# Patient Record
Sex: Male | Born: 1975 | Race: White | Hispanic: No
Health system: Southern US, Community
[De-identification: ages and names within clinical notes are randomized; demographics above are authoritative.]

---

## 2000-11-21 ENCOUNTER — Encounter: Payer: Self-pay | Admitting: Emergency Medicine

## 2000-11-21 ENCOUNTER — Emergency Department (HOSPITAL_COMMUNITY): Admission: AC | Admit: 2000-11-21 | Discharge: 2000-11-21 | Payer: Self-pay

## 2000-12-04 ENCOUNTER — Encounter: Payer: Self-pay | Admitting: Internal Medicine

## 2000-12-04 ENCOUNTER — Ambulatory Visit (HOSPITAL_COMMUNITY): Admission: RE | Admit: 2000-12-04 | Discharge: 2000-12-04 | Payer: Self-pay | Admitting: Internal Medicine

## 2003-04-19 ENCOUNTER — Emergency Department (HOSPITAL_COMMUNITY): Admission: EM | Admit: 2003-04-19 | Discharge: 2003-04-19 | Payer: Self-pay | Admitting: Family Medicine

## 2003-08-18 ENCOUNTER — Emergency Department (HOSPITAL_COMMUNITY): Admission: EM | Admit: 2003-08-18 | Discharge: 2003-08-18 | Payer: Self-pay | Admitting: *Deleted

## 2008-06-06 ENCOUNTER — Emergency Department (HOSPITAL_COMMUNITY): Admission: EM | Admit: 2008-06-06 | Discharge: 2008-06-06 | Payer: Self-pay | Admitting: Family Medicine

## 2013-08-23 ENCOUNTER — Emergency Department (HOSPITAL_COMMUNITY): Payer: 59

## 2013-08-23 ENCOUNTER — Emergency Department (HOSPITAL_COMMUNITY)
Admission: EM | Admit: 2013-08-23 | Discharge: 2013-08-23 | Disposition: A | Discharge status: Home / Self Care | Payer: 59 | Attending: Emergency Medicine | Admitting: Emergency Medicine

## 2013-08-23 ENCOUNTER — Encounter (HOSPITAL_COMMUNITY): Payer: Self-pay | Admitting: Emergency Medicine

## 2013-08-23 DIAGNOSIS — Y9289 Other specified places as the place of occurrence of the external cause: Secondary | ICD-10-CM | POA: Insufficient documentation

## 2013-08-23 DIAGNOSIS — Y9389 Activity, other specified: Secondary | ICD-10-CM | POA: Insufficient documentation

## 2013-08-23 DIAGNOSIS — S1093XA Contusion of unspecified part of neck, initial encounter: Secondary | ICD-10-CM

## 2013-08-23 DIAGNOSIS — S0003XA Contusion of scalp, initial encounter: Secondary | ICD-10-CM | POA: Insufficient documentation

## 2013-08-23 DIAGNOSIS — IMO0002 Reserved for concepts with insufficient information to code with codable children: Secondary | ICD-10-CM | POA: Insufficient documentation

## 2013-08-23 DIAGNOSIS — S0083XA Contusion of other part of head, initial encounter: Secondary | ICD-10-CM | POA: Insufficient documentation

## 2013-08-23 DIAGNOSIS — S0990XA Unspecified injury of head, initial encounter: Secondary | ICD-10-CM

## 2013-08-23 DIAGNOSIS — Z79899 Other long term (current) drug therapy: Secondary | ICD-10-CM | POA: Insufficient documentation

## 2013-08-23 DIAGNOSIS — W1809XA Striking against other object with subsequent fall, initial encounter: Secondary | ICD-10-CM | POA: Insufficient documentation

## 2013-08-23 DIAGNOSIS — S41111A Laceration without foreign body of right upper arm, initial encounter: Secondary | ICD-10-CM

## 2013-08-23 DIAGNOSIS — Z23 Encounter for immunization: Secondary | ICD-10-CM | POA: Insufficient documentation

## 2013-08-23 DIAGNOSIS — W278XXA Contact with other nonpowered hand tool, initial encounter: Secondary | ICD-10-CM | POA: Insufficient documentation

## 2013-08-23 DIAGNOSIS — S51809A Unspecified open wound of unspecified forearm, initial encounter: Secondary | ICD-10-CM | POA: Insufficient documentation

## 2013-08-23 MED ORDER — TETANUS-DIPHTH-ACELL PERTUSSIS 5-2.5-18.5 LF-MCG/0.5 IM SUSP
0.5000 mL | Freq: Once | INTRAMUSCULAR | Status: AC
  Administered 2013-08-23: 0.5 mL via INTRAMUSCULAR
  Filled 2013-08-23: qty 0.5

## 2013-08-23 MED ORDER — OXYCODONE-ACETAMINOPHEN 5-325 MG PO TABS
2.0000 | ORAL_TABLET | Freq: Once | ORAL | Status: AC
  Administered 2013-08-23: 2 via ORAL
  Filled 2013-08-23: qty 2

## 2013-08-23 MED ORDER — HYDROCODONE-ACETAMINOPHEN 5-325 MG PO TABS: 1.0000 | ORAL_TABLET | ORAL | Status: DC | PRN

## 2013-08-23 NOTE — ED Provider Notes (Signed)
Medical screening examination/treatment/procedure(s) were performed by non-physician practitioner and as supervising physician I was immediately available for consultation/collaboration.   EKG Interpretation   Date/Time:  Tuesday August 23 2013 10:41:56 EDT Ventricular Rate:  65 PR Interval:  140 QRS Duration: 68 QT Interval:  373 QTC Calculation: 388 R Axis:   80 Text Interpretation:  Sinus rhythm Ventricular premature complex Consider  left ventricular hypertrophy Anterior Q waves, possibly due to LVH J point  elevation No previous ECGs available Confirmed by YAO  MD, DAVID ([id number])  on 08/23/2013 10:45:26 AM        Richardean Canalavid H Yao, MD 08/23/13 1536

## 2013-08-23 NOTE — Discharge Instructions (Signed)
Take Vicodin as needed for pain. Refer to attached documents for more information.  °

## 2013-08-23 NOTE — ED Notes (Signed)
Patient transported to X-ray 

## 2013-08-23 NOTE — Progress Notes (Signed)
LACERATION REPAIR Performed by: Levert FeinsteinMcIntyre, Helyn Schwan Authorized by: Levert FeinsteinMcIntyre, Marque Bango Consent: Verbal consent obtained. Risks and benefits: risks, benefits and alternatives were discussed Consent given by: patient Patient identity confirmed: provided demographic data Prepped and Draped in normal sterile fashion Wound explored, one small piece of leaf removed  Laceration Location: right forearm (two total lacerations)  Laceration Length: 5cm x2  After irrigation, no foreign bodies seen or palpated  Anesthesia: local infiltration  Local anesthetic: lidocaine 2% without epinephrine  Anesthetic total: 5 ml  Irrigation method: syringe Amount of cleaning: standard  Skin closure: 4-0 vicryl and 4-0 ethilon  Number of sutures: 19 total skin stitches, 3 subcutaneous stitches  Technique: simple interrupted skin sutures overlying 3 subcutaneous  Patient tolerance: Patient tolerated the procedure well with no immediate complications.  The laceration was examined by Dr. Denton LankSteinl after repair and deemed satisfactorily repaired.  Levert FeinsteinBrittany Liborio Saccente, MD Family Medicine PGY-2

## 2013-08-23 NOTE — ED Notes (Signed)
Per pt, cut right arm with prunners after falling and hitting head-states he loss consciousness for a few seconds

## 2013-08-23 NOTE — ED Provider Notes (Signed)
CSN: [id number]     Arrival date & time 08/23/13  1016 History   First MD Initiated Contact with Patient 08/23/13 1024     Chief Complaint  Patient presents with  . Extremity Laceration  . hit head      (Consider location/radiation/quality/duration/timing/severity/associated sxs/prior Treatment) HPI Comments: Patient is a 38 year old male who presents after his work ladder collapsed and he fell. Patient reports hitting his head on the concrete on the ground and cutting his right arm on pruning blade as he was falling. Patient is unsure how high off the ground he was. Patient reports brief loss of consciousness. He complains of throbbing head pain without radiation. He applied pressure to the laceration of the right arm which controlled the bleeding. No other injuries. No aggravating factors.     History reviewed. No pertinent past medical history. History reviewed. No pertinent past surgical history. No family history on file. History  Substance Use Topics  . Smoking status: Never Smoker   . Smokeless tobacco: Not on file  . Alcohol Use: No    Review of Systems  Constitutional: Negative for fever, chills and fatigue.  HENT: Negative for trouble swallowing.   Eyes: Negative for visual disturbance.  Respiratory: Negative for shortness of breath.   Cardiovascular: Negative for chest pain and palpitations.  Gastrointestinal: Negative for nausea, vomiting, abdominal pain and diarrhea.  Genitourinary: Negative for dysuria and difficulty urinating.  Musculoskeletal: Negative for arthralgias and neck pain.  Skin: Positive for wound. Negative for color change.  Neurological: Positive for headaches. Negative for dizziness and weakness.  Psychiatric/Behavioral: Negative for dysphoric mood.      Allergies  Review of patient's allergies indicates no known allergies.  Home Medications   Prior to Admission medications   Medication Sig Start Date End Date Taking? Authorizing Provider   Ascorbic Acid (VITAMIN C) 1000 MG tablet Take 2,000 mg by mouth daily.   Yes Historical Provider, MD  cetirizine (ZYRTEC) 10 MG tablet Take 10 mg by mouth daily as needed for allergies.   Yes Historical Provider, MD  Cholecalciferol (VITAMIN D-3) 1000 UNITS CAPS Take 2 capsules by mouth daily.   Yes Historical Provider, MD  fluticasone (FLONASE) 50 MCG/ACT nasal spray Place 1 spray into both nostrils daily as needed for allergies or rhinitis.   Yes Historical Provider, MD  Multiple Vitamin (MULTIVITAMIN WITH MINERALS) TABS tablet Take 1 tablet by mouth daily.   Yes Historical Provider, MD   BP 116/76  Pulse 65  Temp(Src) 98.1 F (36.7 C) (Oral)  Resp 16  SpO2 100% Physical Exam  Nursing note and vitals reviewed. Constitutional: He is oriented to person, place, and time. He appears well-developed and well-nourished. No distress.  HENT:  Head: Normocephalic and atraumatic.  Hematoma of left occipital area with no open wound and tenderness to palpation.   Eyes: Conjunctivae and EOM are normal. Pupils are equal, round, and reactive to light.  Neck: Normal range of motion.  Cardiovascular: Normal rate and regular rhythm.  Exam reveals no gallop and no friction rub.   No murmur heard. Pulmonary/Chest: Effort normal and breath sounds normal. He has no wheezes. He has no rales. He exhibits no tenderness.  Abdominal: Soft. He exhibits no distension. There is no tenderness. There is no rebound and no guarding.  Musculoskeletal: Normal range of motion.  Painful supination of right forearm. Right mid forearm tenderness to palpation of dorsal right arm. No obvious deformity. 2 separate 4cm deep lacerations just distal of right  AC. Bleeding controlled.   Neurological: He is alert and oriented to person, place, and time. Coordination normal.  Extremity strength and sensation equal and intact bilaterally. Speech is goal-oriented. Moves limbs without ataxia.   Skin: Skin is warm and dry.  2 separate  4cm deep lacerations just distal of right AC. Bleeding controlled.   Psychiatric: He has a normal mood and affect. His behavior is normal.    ED Course  Procedures (including critical care time) Labs Review Labs Reviewed - No data to display  Imaging Review Dg Forearm Right  08/23/2013   CLINICAL DATA:  Laceration proximal right forearm.  EXAM: RIGHT FOREARM - 2 VIEW  COMPARISON:  None.  FINDINGS: Laceration is seen the anterior soft tissues of the proximal forearm without underlying fracture or foreign body.  IMPRESSION: Laceration without fracture or foreign body.   Electronically Signed   By: Drusilla Kannerhomas  Dalessio M.D.   On: 08/23/2013 11:53   Ct Head Wo Contrast  08/23/2013   CLINICAL DATA:  Head trauma, fall  EXAM: CT HEAD WITHOUT CONTRAST  TECHNIQUE: Contiguous axial images were obtained from the base of the skull through the vertex without intravenous contrast.  COMPARISON:  None.  FINDINGS: No skull fracture is noted. Paranasal sinuses and mastoid air cells are unremarkable.  No intracranial hemorrhage, mass effect or midline shift.  No hydrocephalus. The gray and white-matter differentiation is preserved. No intra or extra-axial fluid collection.  IMPRESSION: No acute intracranial abnormality.   Electronically Signed   By: Natasha MeadLiviu  Pop M.D.   On: 08/23/2013 11:05     EKG Interpretation   Date/Time:  Tuesday August 23 2013 10:41:56 EDT Ventricular Rate:  65 PR Interval:  140 QRS Duration: 68 QT Interval:  373 QTC Calculation: 388 R Axis:   80 Text Interpretation:  Sinus rhythm Ventricular premature complex Consider  left ventricular hypertrophy Anterior Q waves, possibly due to LVH J point  elevation No previous ECGs available Confirmed by YAO  MD, DAVID ([id number])  on 08/23/2013 10:45:26 AM      MDM   Final diagnoses:  Head injury, initial encounter  Arm laceration, right, initial encounter    11:24 AM CT head unremarkable for acute changes. No neuro deficits. Patient has right  forearm pain which he will be imaged. Patient will have tdap and percocet here. No neuro vascular compromise.   3:00 PM Laceration repaired by resident. Patient instructed to return in 7 days for suture removal. Patient will be discharged with pain medication.     Emilia BeckKaitlyn Szekalski, PA-C 08/23/13 1506

## 2014-12-05 ENCOUNTER — Encounter (HOSPITAL_COMMUNITY): Payer: Self-pay | Admitting: *Deleted

## 2014-12-05 ENCOUNTER — Emergency Department (HOSPITAL_COMMUNITY): Payer: 59

## 2014-12-05 ENCOUNTER — Emergency Department (HOSPITAL_COMMUNITY)
Admission: EM | Admit: 2014-12-05 | Discharge: 2014-12-05 | Disposition: A | Discharge status: Home / Self Care | Payer: 59 | Attending: Emergency Medicine | Admitting: Emergency Medicine

## 2014-12-05 DIAGNOSIS — S61210A Laceration without foreign body of right index finger without damage to nail, initial encounter: Secondary | ICD-10-CM | POA: Diagnosis not present

## 2014-12-05 DIAGNOSIS — Z79899 Other long term (current) drug therapy: Secondary | ICD-10-CM | POA: Insufficient documentation

## 2014-12-05 DIAGNOSIS — Y9289 Other specified places as the place of occurrence of the external cause: Secondary | ICD-10-CM | POA: Diagnosis not present

## 2014-12-05 DIAGNOSIS — Y9389 Activity, other specified: Secondary | ICD-10-CM | POA: Insufficient documentation

## 2014-12-05 DIAGNOSIS — S61232A Puncture wound without foreign body of right middle finger without damage to nail, initial encounter: Secondary | ICD-10-CM | POA: Diagnosis not present

## 2014-12-05 DIAGNOSIS — Y998 Other external cause status: Secondary | ICD-10-CM | POA: Insufficient documentation

## 2014-12-05 DIAGNOSIS — W228XXA Striking against or struck by other objects, initial encounter: Secondary | ICD-10-CM | POA: Diagnosis not present

## 2014-12-05 DIAGNOSIS — S61219A Laceration without foreign body of unspecified finger without damage to nail, initial encounter: Secondary | ICD-10-CM

## 2014-12-05 MED ORDER — BUPIVACAINE HCL (PF) 0.25 % IJ SOLN
20.0000 mL | Freq: Once | INTRAMUSCULAR | Status: AC
  Administered 2014-12-05: 10 mL
  Filled 2014-12-05: qty 20

## 2014-12-05 MED ORDER — BACITRACIN ZINC 500 UNIT/GM EX OINT
TOPICAL_OINTMENT | Freq: Two times a day (BID) | CUTANEOUS | Status: DC
  Administered 2014-12-05: 1 via TOPICAL
  Filled 2014-12-05: qty 0.9

## 2014-12-05 MED ORDER — LIDOCAINE HCL (PF) 1 % IJ SOLN
5.0000 mL | Freq: Once | INTRAMUSCULAR | Status: AC
  Administered 2014-12-05: 5 mL
  Filled 2014-12-05: qty 5

## 2014-12-05 MED ORDER — CEPHALEXIN 500 MG PO CAPS: 500.0000 mg | ORAL_CAPSULE | Freq: Four times a day (QID) | ORAL | Status: DC

## 2014-12-05 MED ORDER — HYDROCODONE-ACETAMINOPHEN 5-325 MG PO TABS: 1.0000 | ORAL_TABLET | Freq: Four times a day (QID) | ORAL | Status: DC | PRN

## 2014-12-05 MED ORDER — CEPHALEXIN 250 MG PO CAPS
500.0000 mg | ORAL_CAPSULE | Freq: Once | ORAL | Status: AC
  Administered 2014-12-05: 500 mg via ORAL
  Filled 2014-12-05: qty 2

## 2014-12-05 NOTE — Discharge Instructions (Signed)
Call Dr. Janee Morn tomorrow for follow up. Do not take the narcotic if driving or operating machinery because it will make you sleepy.

## 2014-12-05 NOTE — ED Notes (Signed)
Suture cart at bedside 

## 2014-12-05 NOTE — ED Notes (Signed)
PT reports e cut RT Rt index finger gear with a sprocket. Pt reports sprocket stuck in finger.

## 2014-12-05 NOTE — ED Provider Notes (Signed)
CSN: [id number]     Arrival date & time 12/05/14  1310 History  By signing my name below, I, Ronney Lion, attest that this documentation has been prepared under the direction and in the presence of Kerrie Buffalo, NP. Electronically Signed: Ronney Lion, ED Scribe. 12/05/2014. 4:48 PM.    Chief Complaint  Patient presents with  . Laceration   Patient is a 39 y.o. male presenting with skin laceration. The history is provided by the patient. No language interpreter was used.  Laceration Location:  Hand Hand laceration location:  R finger Quality: jagged   Injury mechanism: sprocket. Pain details:    Quality:  Aching   Severity:  Moderate   Timing:  Constant   Progression:  Worsening Relieved by:  Nothing Worsened by:  Nothing tried Ineffective treatments:  None tried Tetanus status:  Up to date   HPI Comments: Edward Hanna is a 39 y.o. male who presents to the Emergency Department complaining of a laceration to his right index and right middle fingers after accidentally cutting his fingers on a sprocket. Patient states he came in to the ED because he started to have numbness radiating up his right forearm. He reports his tetanus vaccination is UTD.  History reviewed. No pertinent past medical history. History reviewed. No pertinent past surgical history. History reviewed. No pertinent family history. Social History  Substance Use Topics  . Smoking status: Never Smoker   . Smokeless tobacco: Never Used  . Alcohol Use: No    Review of Systems  Skin: Positive for wound.  All other systems reviewed and are negative.  Allergies  Review of patient's allergies indicates no known allergies.  Home Medications   Prior to Admission medications   Medication Sig Start Date End Date Taking? Authorizing Provider  Ascorbic Acid (VITAMIN C) 1000 MG tablet Take 2,000 mg by mouth daily.    Historical Provider, MD  cephALEXin (KEFLEX) 500 MG capsule Take 1 capsule (500 mg total) by mouth  4 (four) times daily. 12/05/14   Hope Orlene Och, NP  cetirizine (ZYRTEC) 10 MG tablet Take 10 mg by mouth daily as needed for allergies.    Historical Provider, MD  Cholecalciferol (VITAMIN D-3) 1000 UNITS CAPS Take 2 capsules by mouth daily.    Historical Provider, MD  fluticasone (FLONASE) 50 MCG/ACT nasal spray Place 1 spray into both nostrils daily as needed for allergies or rhinitis.    Historical Provider, MD  HYDROcodone-acetaminophen (NORCO) 5-325 MG tablet Take 1 tablet by mouth every 6 (six) hours as needed. 12/05/14   Hope Orlene Och, NP  Multiple Vitamin (MULTIVITAMIN WITH MINERALS) TABS tablet Take 1 tablet by mouth daily.    Historical Provider, MD   BP 117/92 mmHg  Pulse 66  Temp(Src) 98.4 F (36.9 C) (Oral)  Resp 14  Ht  (1.854 m)  Wt 182 lb (82.555 kg)  BMI 24.02 kg/m2  SpO2 97% Physical Exam  Constitutional: He is oriented to person, place, and time. He appears well-developed and well-nourished. No distress.  HENT:  Head: Normocephalic and atraumatic.  Eyes: Conjunctivae and EOM are normal.  Neck: Neck supple. No tracheal deviation present.  Cardiovascular: Normal rate.   Pulmonary/Chest: Effort normal. No respiratory distress.  Musculoskeletal: Normal range of motion.  Decreased sensation in the distal portion of the right index finger.  Neurological: He is alert and oriented to person, place, and time.  Skin: Skin is warm and dry.  Puncture wound to the right middle finger. Right index  finger has jagged lacerations at the PIP.  Psychiatric: He has a normal mood and affect. His behavior is normal.  Nursing note and vitals reviewed.   ED Course  Procedures (including critical care time)  DIAGNOSTIC STUDIES: Oxygen Saturation is 97% on RA, normal by my interpretation.    COORDINATION OF CARE: 1:52 PM - Discussed treatment plan with pt at bedside which includes XR and injection of local anesthetic to further examine the wound. Pt verbalized understanding and  agreed to plan.  Dg Hand Complete Right  12/05/2014  CLINICAL DATA:  Right finger laceration EXAM: RIGHT HAND - COMPLETE 3+ VIEW COMPARISON:  None. FINDINGS: Three views of the right hand submitted. No acute fracture or subluxation. No radiopaque foreign body. IMPRESSION: Negative. Electronically Signed   By: Natasha Mead M.D.   On: 12/05/2014 14:43   3:00 PM - Consult with attending physician Dr. Littie Deeds who recommends f/u with hand surgeon to evaluate for possible nerve damage.   DIGITAL BLOCK PROCEDURE NOTE The patient's identification was confirmed and consent was obtained. This procedure was performed by Kerrie Buffalo, NP, at 3:31 PM. Site: Right index finger Sterile procedures observed Anesthetic used (type and amt): lidocaine 1% and bupivacaine 0.25% mixed equally, 5 mL  Patient tolerated procedure well without complications.   LACERATION REPAIR PROCEDURE NOTE The patient's identification was confirmed and consent was obtained. This procedure was performed by Kerrie Buffalo, NP, at 3:31 PM. Site: Right index finger Sterile procedures observed Anesthetic used (type and amt): digital block (see above note) Suture type/size:4-0 prolene Length: 2 cm, jagged L-shaped # of Sutures: 3 Technique: simple interrupted, loosely closed Complexity Antibx ointment applied Tetanus UTD Site anesthetized, irrigated with NS, explored without evidence of foreign body, wound well approximated, site covered with dry, sterile dressing.  Patient tolerated procedure well without complications. Instructions for care discussed verbally and patient provided with additional written instructions for homecare and f/u.  Discussed with pt that he may have injured the nerve due to decreased sensation. Advised f/u with hand surgeon Dr. Janee Morn. Pt verbalized understanding and agreed to plan.     MDM  39 y.o. male with laceration to the right index finger stable for d/c without neurovascular compromise. Decreased  sensation to the distal aspect of the finger. Discussed with the patient in detail need for follow up with the hand surgeon. Patient agrees with plan. Will start antibiotics and pain management. Patient will return if if any problems occur.   Final diagnoses:  Laceration of finger, right, initial encounter   I personally performed the services described in this documentation, which was scribed in my presence. The recorded information has been reviewed and is accurate.     [address] [place], NP 12/08/14 1610  Mirian Mo, MD 12/10/14 2005

## 2014-12-05 NOTE — ED Notes (Signed)
Marcaine requested from pharmacy.

## 2016-05-14 IMAGING — DX DG HAND COMPLETE 3+V*R*
3 series · 3 of 3 positions shown · non-contrast
Comparison: None.

CLINICAL DATA: Right finger laceration

EXAM:
RIGHT HAND - COMPLETE 3+ VIEW

[x hand pa right]
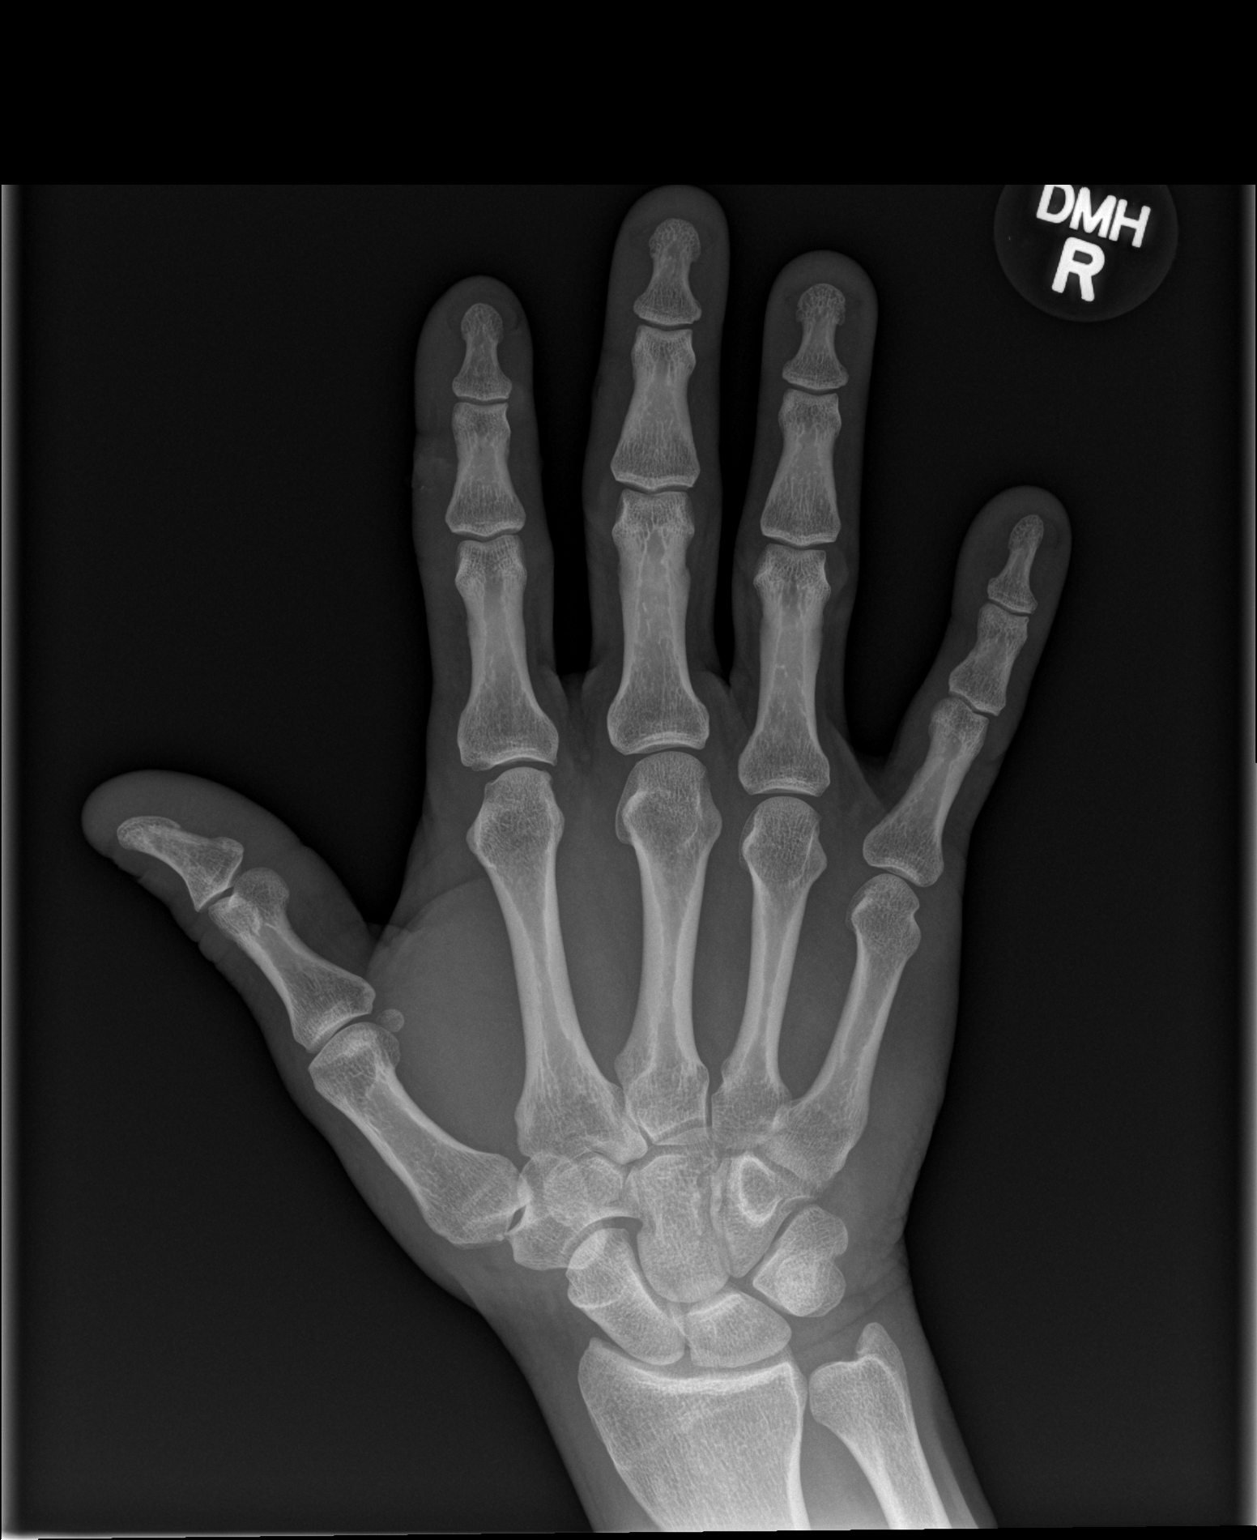

[x hand obl right]
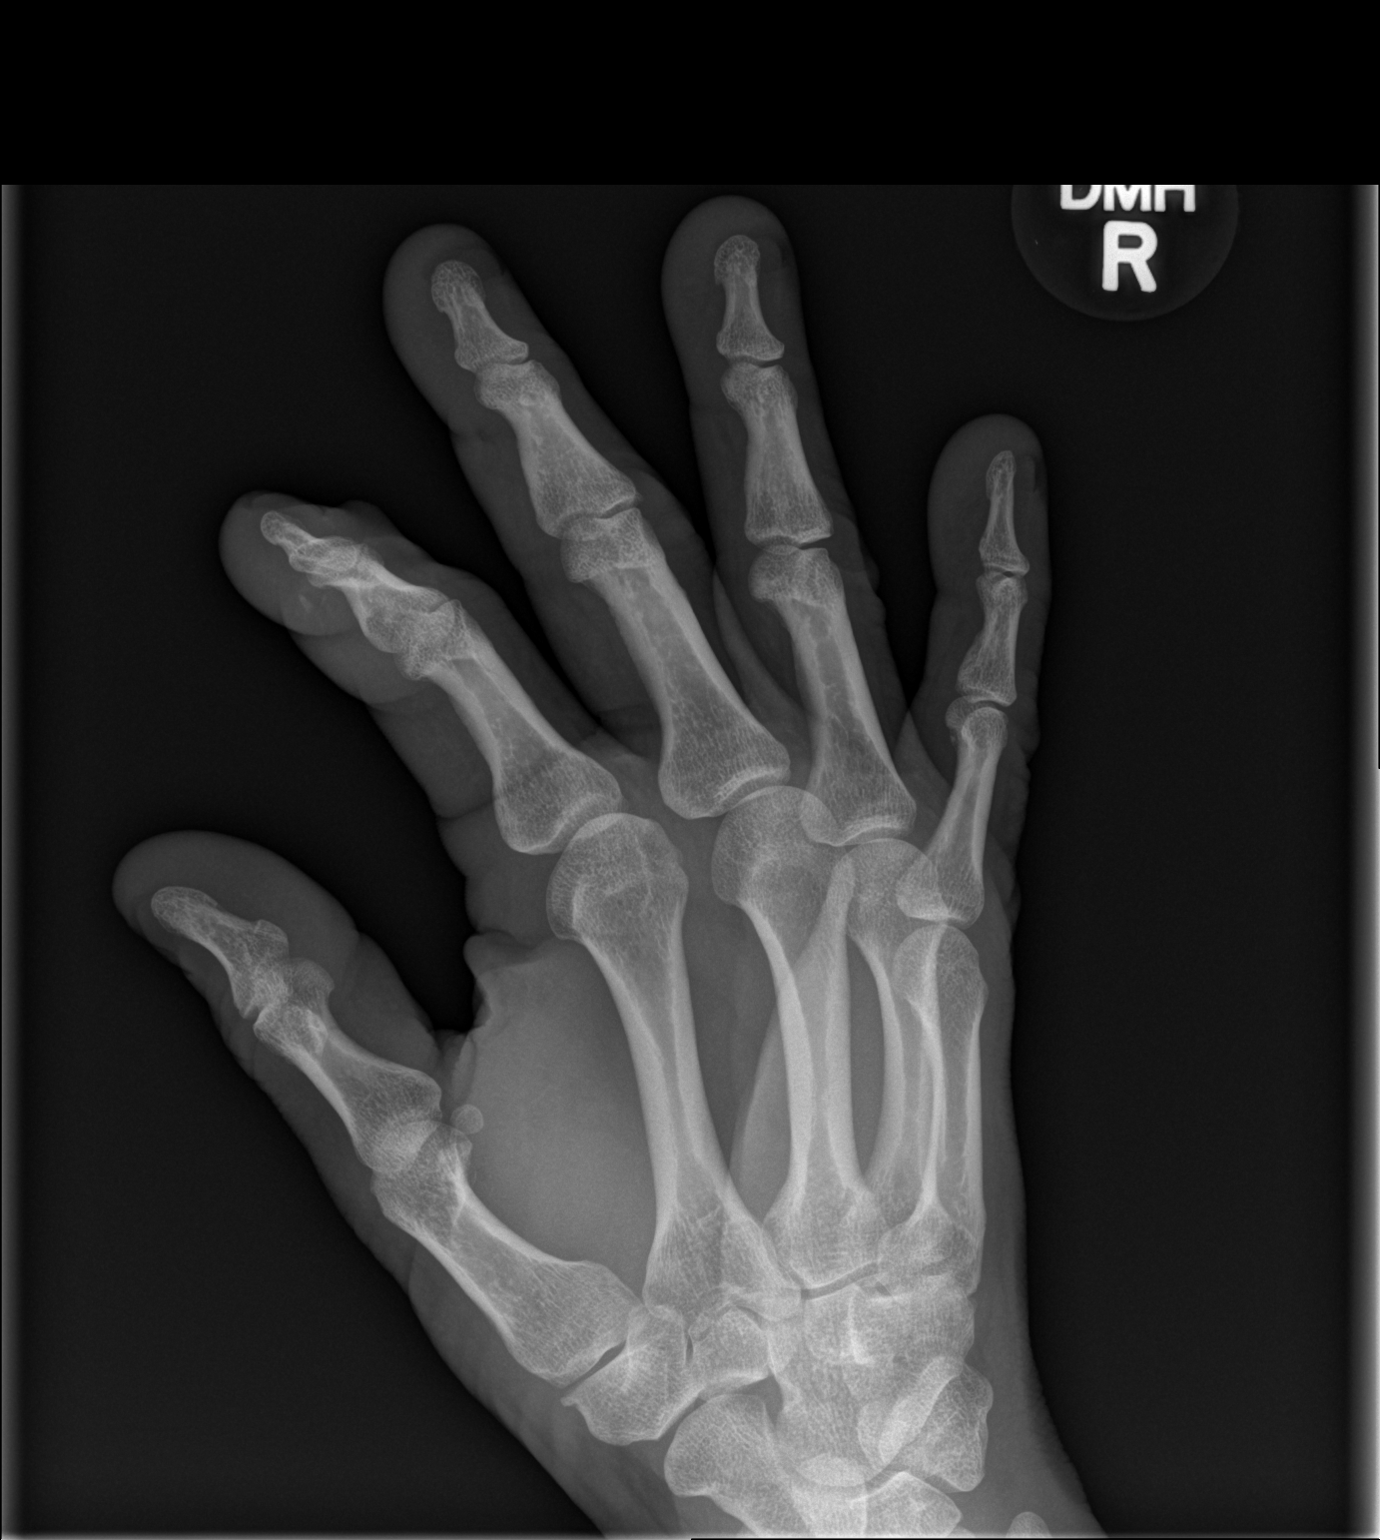

[x hand lat right]
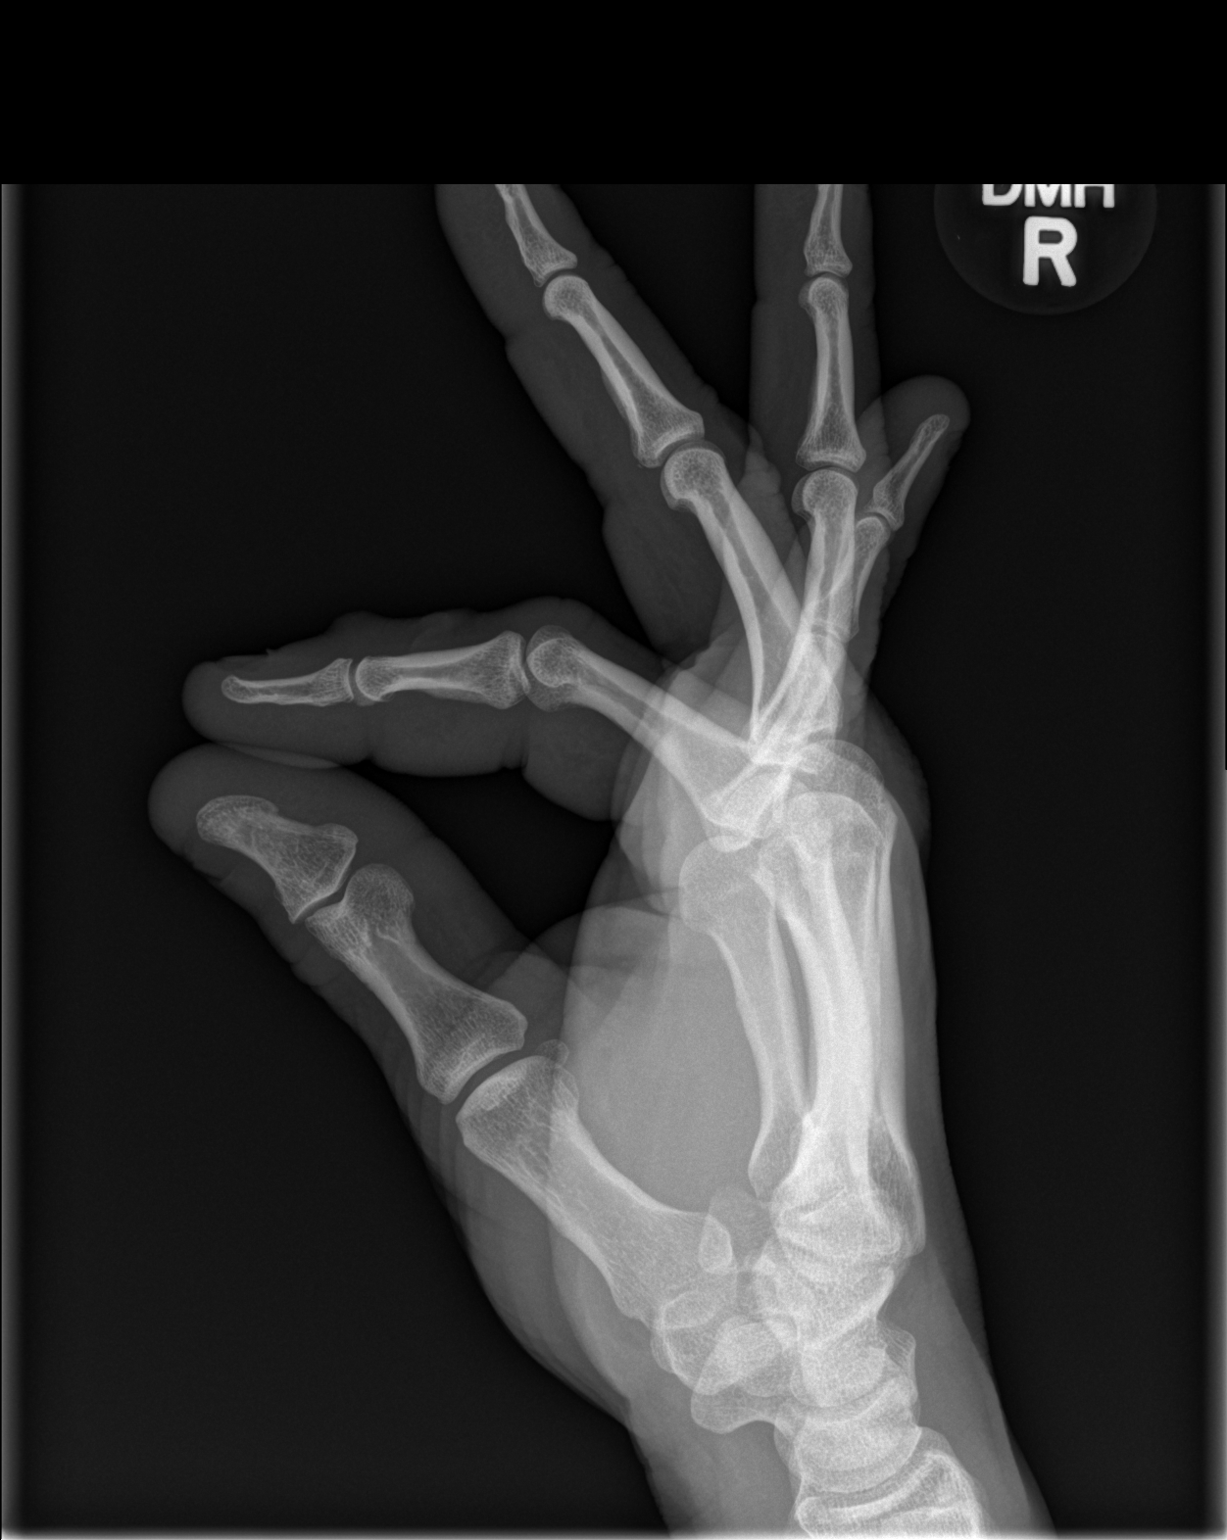

[3 of 3 positions shown; findings below may reference images not displayed]

FINDINGS: Three views of the right hand submitted. No acute fracture or
subluxation. No radiopaque foreign body.
IMPRESSION: Negative.

## 2022-02-28 ENCOUNTER — Telehealth (HOSPITAL_COMMUNITY): Payer: Self-pay | Admitting: *Deleted

## 2022-02-28 ENCOUNTER — Ambulatory Visit (HOSPITAL_COMMUNITY)
Admission: EM | Admit: 2022-02-28 | Discharge: 2022-02-28 | Disposition: A | Discharge status: Home / Self Care | Payer: 59 | Attending: Family Medicine | Admitting: Family Medicine

## 2022-02-28 ENCOUNTER — Encounter (HOSPITAL_COMMUNITY): Payer: Self-pay

## 2022-02-28 DIAGNOSIS — H1032 Unspecified acute conjunctivitis, left eye: Secondary | ICD-10-CM | POA: Diagnosis not present

## 2022-02-28 DIAGNOSIS — N342 Other urethritis: Secondary | ICD-10-CM | POA: Insufficient documentation

## 2022-02-28 MED ORDER — DOXYCYCLINE HYCLATE 100 MG PO CAPS: 100.0000 mg | ORAL_CAPSULE | Freq: Two times a day (BID) | ORAL | 0 refills | Status: AC

## 2022-02-28 MED ORDER — DOXYCYCLINE HYCLATE 100 MG PO CAPS: 100.0000 mg | ORAL_CAPSULE | Freq: Two times a day (BID) | ORAL | 0 refills | Status: DC

## 2022-02-28 MED ORDER — GENTAMICIN SULFATE 0.3 % OP SOLN: 2.0000 [drp] | Freq: Three times a day (TID) | OPHTHALMIC | 0 refills | Status: DC

## 2022-02-28 MED ORDER — GENTAMICIN SULFATE 0.3 % OP SOLN: 2.0000 [drp] | Freq: Three times a day (TID) | OPHTHALMIC | 0 refills | Status: AC

## 2022-02-28 MED ORDER — CEFTRIAXONE SODIUM 500 MG IJ SOLR
INTRAMUSCULAR | Status: AC
  Filled 2022-02-28: qty 500

## 2022-02-28 MED ORDER — LIDOCAINE HCL (PF) 1 % IJ SOLN
INTRAMUSCULAR | Status: AC
  Filled 2022-02-28: qty 2

## 2022-02-28 MED ORDER — CEFTRIAXONE SODIUM 500 MG IJ SOLR
500.0000 mg | INTRAMUSCULAR | Status: DC
  Administered 2022-02-28: 500 mg via INTRAMUSCULAR

## 2022-02-28 NOTE — Discharge Instructions (Addendum)
Staff will notify you of any positives on your swabs.  If you download MyChart, you would see the results a little faster  You have been given a shot of ceftriaxone 500 mg  Take doxycycline 100 mg --1 capsule 2 times daily for 7 days  Put gentamicin eyedrops in the affected eye(s) 3 times daily for 7 days.

## 2022-02-28 NOTE — ED Provider Notes (Addendum)
[facility]    CSN: [id number] Arrival date & time: 02/28/22  1552      History   Chief Complaint Chief Complaint  Patient presents with   Penile Discharge   Eye Drainage    HPI Edward Hanna is a 47 y.o. male.    Penile Discharge   Here for penile discharge that began 3 days ago.  No dysuria.  No abdominal pain or vomiting.  No fever  Also about 3 days ago he began having tearing and irritation of his left eye.  It has worsened and he now has swelling of the upper and lower eyelid.  No known STD exposures  No congestion or upper respiratory symptoms or cough History reviewed. No pertinent past medical history.  There are no problems to display for this patient.   History reviewed. No pertinent surgical history.     Home Medications    Prior to Admission medications   Medication Sig Start Date End Date Taking? Authorizing Provider  doxycycline (VIBRAMYCIN) 100 MG capsule Take 1 capsule (100 mg total) by mouth 2 (two) times daily for 7 days. 02/28/22 03/07/22 Yes Arnaldo Heffron, Gwenlyn Perking, MD  gentamicin (GARAMYCIN) 0.3 % ophthalmic solution Place 2 drops into both eyes 3 (three) times daily for 7 days. 02/28/22 03/07/22 Yes Giordana Weinheimer, Gwenlyn Perking, MD  Ascorbic Acid (VITAMIN C) 1000 MG tablet Take 2,000 mg by mouth daily.    [provider]  cetirizine (ZYRTEC) 10 MG tablet Take 10 mg by mouth daily as needed for allergies.    [provider]  Cholecalciferol (VITAMIN D-3) 1000 UNITS CAPS Take 2 capsules by mouth daily.    [provider]  fluticasone (FLONASE) 50 MCG/ACT nasal spray Place 1 spray into both nostrils daily as needed for allergies or rhinitis.    [provider]  Multiple Vitamin (MULTIVITAMIN WITH MINERALS) TABS tablet Take 1 tablet by mouth daily.    [provider]    Family History History reviewed. No pertinent family history.  Social History Social History   Tobacco Use   Smoking status: Never    Smokeless tobacco: Never  Substance Use Topics   Alcohol use: No   Drug use: No     Allergies   Patient has no known allergies.   Review of Systems Review of Systems  Genitourinary:  Positive for penile discharge.     Physical Exam Triage Vital Signs ED Triage Vitals  Enc Vitals Group     BP 02/28/22 1711 (!) 137/98     Pulse --      Resp 02/28/22 1711 16     Temp 02/28/22 1711 98.6 F (37 C)     Temp Source 02/28/22 1711 Oral     SpO2 02/28/22 1711 99 %     Weight --      Height --      Head Circumference --      Peak Flow --      Pain Score 02/28/22 1712 0     Pain Loc --      Pain Edu? --      Excl. in [place]? --    No data found.  Updated Vital Signs BP (!) 137/98 (BP Location: Left Arm)   Temp 98.6 F (37 C) (Oral)   Resp 16   SpO2 99%   Visual Acuity Right Eye Distance:   Left Eye Distance:   Bilateral Distance:    Right Eye Near:   Left Eye Near:  Bilateral Near:     Physical Exam Vitals reviewed.  Constitutional:      General: He is not in acute distress.    Appearance: He is not ill-appearing, toxic-appearing or diaphoretic.  HENT:     Nose: Nose normal.     Mouth/Throat:     Mouth: Mucous membranes are moist.     Pharynx: No oropharyngeal exudate or posterior oropharyngeal erythema.  Eyes:     Extraocular Movements: Extraocular movements intact.     Pupils: Pupils are equal, round, and reactive to light.     Comments: The left eyes injected.  The upper and lower eyelids are erythematous and mildly swollen.  There is some erythema of the lower eyelid and a spot about 1/2 cm in diameter and a smaller 1 about 2 mm in diameter laterally.  No ulcerations noted.  There is some discharge in the inner canthus.  Cardiovascular:     Rate and Rhythm: Normal rate and regular rhythm.     Heart sounds: No murmur heard. Pulmonary:     Effort: Pulmonary effort is normal.     Breath sounds: Normal breath sounds.  Musculoskeletal:     Cervical  back: Neck supple.  Lymphadenopathy:     Cervical: No cervical adenopathy.  Skin:    Coloration: Skin is not jaundiced or pale.  Neurological:     Mental Status: He is alert.      UC Treatments / Results  Labs (all labs ordered are listed, but only abnormal results are displayed) Labs Reviewed  CYTOLOGY, (ORAL, ANAL, URETHRAL) ANCILLARY ONLY  CYTOLOGY, (ORAL, ANAL, URETHRAL) ANCILLARY ONLY    EKG   Radiology No results found.  Procedures Procedures (including critical care time)  Medications Ordered in UC Medications  cefTRIAXone (ROCEPHIN) injection 500 mg (has no administration in time range)    Initial Impression / Assessment and Plan / UC Course  I have reviewed the triage vital signs and the nursing notes.  Pertinent labs & imaging results that were available during my care of the patient were reviewed by me and considered in my medical decision making (see chart for details).        He did a self swab of his urethra.  Though the the eye infection may be a routine conjunctivitis, I swabbed the discharge from the left eye for gonorrhea and chlamydia also.  Staff will notify him of any positives.  The concern about STD in his eye, we are treating him with Rocephin tonight.  Also doxycycline is sent in.  Gentamicin eyedrops are sent in for topical treatment for possible routine conjunctivitis. Final Clinical Impressions(s) / UC Diagnoses   Final diagnoses:  Urethritis  Acute conjunctivitis of left eye, unspecified acute conjunctivitis type     Discharge Instructions      Staff will notify you of any positives on your swabs.  If you download MyChart, you would see the results a little faster  You have been given a shot of ceftriaxone 500 mg  Take doxycycline 100 mg --1 capsule 2 times daily for 7 days  Put gentamicin eyedrops in the affected eye(s) 3 times daily for 7 days.      ED Prescriptions     Medication Sig Dispense Auth. Provider    doxycycline (VIBRAMYCIN) 100 MG capsule Take 1 capsule (100 mg total) by mouth 2 (two) times daily for 7 days. 14 capsule Barrett Henle, MD   gentamicin (GARAMYCIN) 0.3 % ophthalmic solution Place 2 drops into both  eyes 3 (three) times daily for 7 days. 5 mL Zenia Resides, MD      PDMP not reviewed this encounter.   Zenia Resides, MD 02/28/22 1741    Zenia Resides, MD 02/28/22 [phone]

## 2022-02-28 NOTE — ED Triage Notes (Signed)
Pt reports penile discharge for several days. Pt would like STD-testing.  Pt reports left eye drainage x 2-3 days.

## 2022-03-04 LAB — CYTOLOGY, (ORAL, ANAL, URETHRAL) ANCILLARY ONLY
Chlamydia: NEGATIVE
Chlamydia: NEGATIVE
Comment: NEGATIVE
Comment: NORMAL
Comment: NEGATIVE
Comment: NEGATIVE
Comment: NORMAL
Neisseria Gonorrhea: POSITIVE — AB
Neisseria Gonorrhea: POSITIVE — AB
Trichomonas: POSITIVE — AB

## 2022-03-05 ENCOUNTER — Telehealth (HOSPITAL_COMMUNITY): Payer: Self-pay | Admitting: Emergency Medicine

## 2022-03-05 MED ORDER — METRONIDAZOLE 500 MG PO TABS: 2000.0000 mg | ORAL_TABLET | Freq: Once | ORAL | 0 refills | Status: AC

## 2022-03-11 ENCOUNTER — Emergency Department (HOSPITAL_COMMUNITY): Payer: 59

## 2022-03-11 ENCOUNTER — Other Ambulatory Visit: Payer: Self-pay

## 2022-03-11 ENCOUNTER — Emergency Department (HOSPITAL_COMMUNITY)
Admission: EM | Admit: 2022-03-11 | Discharge: 2022-03-11 | Disposition: A | Discharge status: Home / Self Care | Payer: 59 | Attending: Emergency Medicine | Admitting: Emergency Medicine

## 2022-03-11 ENCOUNTER — Encounter (HOSPITAL_COMMUNITY): Payer: Self-pay | Admitting: Radiology

## 2022-03-11 DIAGNOSIS — W19XXXA Unspecified fall, initial encounter: Secondary | ICD-10-CM

## 2022-03-11 DIAGNOSIS — S63259A Unspecified dislocation of unspecified finger, initial encounter: Secondary | ICD-10-CM

## 2022-03-11 DIAGNOSIS — S2239XA Fracture of one rib, unspecified side, initial encounter for closed fracture: Secondary | ICD-10-CM

## 2022-03-11 DIAGNOSIS — M25522 Pain in left elbow: Secondary | ICD-10-CM | POA: Insufficient documentation

## 2022-03-11 DIAGNOSIS — Z043 Encounter for examination and observation following other accident: Secondary | ICD-10-CM | POA: Diagnosis not present

## 2022-03-11 DIAGNOSIS — W12XXXA Fall on and from scaffolding, initial encounter: Secondary | ICD-10-CM | POA: Diagnosis not present

## 2022-03-11 DIAGNOSIS — S63282A Dislocation of proximal interphalangeal joint of right middle finger, initial encounter: Secondary | ICD-10-CM | POA: Diagnosis not present

## 2022-03-11 DIAGNOSIS — R519 Headache, unspecified: Secondary | ICD-10-CM | POA: Insufficient documentation

## 2022-03-11 DIAGNOSIS — S2232XA Fracture of one rib, left side, initial encounter for closed fracture: Secondary | ICD-10-CM | POA: Diagnosis not present

## 2022-03-11 DIAGNOSIS — R202 Paresthesia of skin: Secondary | ICD-10-CM | POA: Diagnosis not present

## 2022-03-11 DIAGNOSIS — S63286A Dislocation of proximal interphalangeal joint of right little finger, initial encounter: Secondary | ICD-10-CM | POA: Diagnosis not present

## 2022-03-11 DIAGNOSIS — M79641 Pain in right hand: Secondary | ICD-10-CM | POA: Diagnosis not present

## 2022-03-11 DIAGNOSIS — S3991XA Unspecified injury of abdomen, initial encounter: Secondary | ICD-10-CM | POA: Diagnosis not present

## 2022-03-11 DIAGNOSIS — R42 Dizziness and giddiness: Secondary | ICD-10-CM | POA: Diagnosis not present

## 2022-03-11 DIAGNOSIS — Y900 Blood alcohol level of less than 20 mg/100 ml: Secondary | ICD-10-CM | POA: Diagnosis not present

## 2022-03-11 DIAGNOSIS — K5730 Diverticulosis of large intestine without perforation or abscess without bleeding: Secondary | ICD-10-CM | POA: Diagnosis not present

## 2022-03-11 DIAGNOSIS — R9431 Abnormal electrocardiogram [ECG] [EKG]: Secondary | ICD-10-CM | POA: Diagnosis not present

## 2022-03-11 DIAGNOSIS — S62626A Displaced fracture of medial phalanx of right little finger, initial encounter for closed fracture: Secondary | ICD-10-CM | POA: Diagnosis not present

## 2022-03-11 DIAGNOSIS — S59911A Unspecified injury of right forearm, initial encounter: Secondary | ICD-10-CM | POA: Diagnosis not present

## 2022-03-11 DIAGNOSIS — S2231XA Fracture of one rib, right side, initial encounter for closed fracture: Secondary | ICD-10-CM | POA: Insufficient documentation

## 2022-03-11 DIAGNOSIS — N209 Urinary calculus, unspecified: Secondary | ICD-10-CM | POA: Diagnosis not present

## 2022-03-11 DIAGNOSIS — S59902A Unspecified injury of left elbow, initial encounter: Secondary | ICD-10-CM | POA: Diagnosis not present

## 2022-03-11 DIAGNOSIS — S2241XA Multiple fractures of ribs, right side, initial encounter for closed fracture: Secondary | ICD-10-CM | POA: Diagnosis not present

## 2022-03-11 DIAGNOSIS — Y9339 Activity, other involving climbing, rappelling and jumping off: Secondary | ICD-10-CM | POA: Insufficient documentation

## 2022-03-11 DIAGNOSIS — J90 Pleural effusion, not elsewhere classified: Secondary | ICD-10-CM | POA: Diagnosis not present

## 2022-03-11 DIAGNOSIS — M542 Cervicalgia: Secondary | ICD-10-CM | POA: Diagnosis not present

## 2022-03-11 DIAGNOSIS — R1084 Generalized abdominal pain: Secondary | ICD-10-CM | POA: Insufficient documentation

## 2022-03-11 DIAGNOSIS — R69 Illness, unspecified: Secondary | ICD-10-CM | POA: Diagnosis not present

## 2022-03-11 DIAGNOSIS — R079 Chest pain, unspecified: Secondary | ICD-10-CM | POA: Diagnosis not present

## 2022-03-11 LAB — COMPREHENSIVE METABOLIC PANEL
ALT: 82 U/L — ABNORMAL HIGH (ref 0–44)
AST: 105 U/L — ABNORMAL HIGH (ref 15–41)
Albumin: 4.2 g/dL (ref 3.5–5.0)
Alkaline Phosphatase: 61 U/L (ref 38–126)
Anion gap: 12 (ref 5–15)
BUN: 21 mg/dL — ABNORMAL HIGH (ref 6–20)
CO2: 23 mmol/L (ref 22–32)
Calcium: 9.1 mg/dL (ref 8.9–10.3)
Chloride: 103 mmol/L (ref 98–111)
Creatinine, Ser: 1.08 mg/dL (ref 0.61–1.24)
GFR, Estimated: >60 mL/min (ref >60)
Glucose, Bld: 105 mg/dL — ABNORMAL HIGH (ref 70–99)
Potassium: 4.3 mmol/L (ref 3.5–5.1)
Sodium: 138 mmol/L (ref 135–145)
Total Bilirubin: 0.9 mg/dL (ref 0.3–1.2)
Total Protein: 7.1 g/dL (ref 6.5–8.1)

## 2022-03-11 LAB — CBC
HCT: 42.4 % (ref 39.0–52.0)
Hemoglobin: 14.8 g/dL (ref 13.0–17.0)
MCH: 29.1 pg (ref 26.0–34.0)
MCHC: 34.9 g/dL (ref 30.0–36.0)
MCV: 83.3 fL (ref 80.0–100.0)
Platelets: 327 10*3/uL (ref 150–400)
RBC: 5.09 MIL/uL (ref 4.22–5.81)
RDW: 11.5 % (ref 11.5–15.5)
WBC: 16.5 10*3/uL — ABNORMAL HIGH (ref 4.0–10.5)
nRBC: 0 % (ref 0.0–0.2)

## 2022-03-11 LAB — I-STAT CHEM 8, ED
BUN: 21 mg/dL — ABNORMAL HIGH (ref 6–20)
Calcium, Ion: 1.17 mmol/L (ref 1.15–1.40)
Chloride: 105 mmol/L (ref 98–111)
Creatinine, Ser: 1 mg/dL (ref 0.61–1.24)
Glucose, Bld: 100 mg/dL — ABNORMAL HIGH (ref 70–99)
HCT: 43 % (ref 39.0–52.0)
Hemoglobin: 14.6 g/dL (ref 13.0–17.0)
Potassium: 4.4 mmol/L (ref 3.5–5.1)
Sodium: 140 mmol/L (ref 135–145)
TCO2: 26 mmol/L (ref 22–32)

## 2022-03-11 LAB — TYPE AND SCREEN
ABO/RH(D): B NEG
Antibody Screen: NEGATIVE

## 2022-03-11 LAB — SAMPLE TO BLOOD BANK

## 2022-03-11 LAB — PROTIME-INR
INR: 1.1 (ref 0.8–1.2)
Prothrombin Time: 14.3 seconds (ref 11.4–15.2)

## 2022-03-11 LAB — ETHANOL: Alcohol, Ethyl (B): <10 mg/dL (ref <10)

## 2022-03-11 LAB — LACTIC ACID, PLASMA: Lactic Acid, Venous: 1.6 mmol/L (ref 0.5–1.9)

## 2022-03-11 MED ORDER — OXYCODONE-ACETAMINOPHEN 5-325 MG PO TABS: 1.0000 | ORAL_TABLET | Freq: Four times a day (QID) | ORAL | 0 refills | Status: AC | PRN

## 2022-03-11 MED ORDER — IOHEXOL 300 MG/ML  SOLN
100.0000 mL | Freq: Once | INTRAMUSCULAR | Status: AC | PRN
  Administered 2022-03-11: 100 mL via INTRAVENOUS

## 2022-03-11 MED ORDER — FENTANYL CITRATE PF 50 MCG/ML IJ SOSY: 50.0000 ug | PREFILLED_SYRINGE | Freq: Once | INTRAMUSCULAR | Status: DC

## 2022-03-11 MED ORDER — SODIUM CHLORIDE 0.9 % IV BOLUS
1000.0000 mL | Freq: Once | INTRAVENOUS | Status: AC
  Administered 2022-03-11: 1000 mL via INTRAVENOUS

## 2022-03-11 MED ORDER — SODIUM CHLORIDE (PF) 0.9 % IJ SOLN
INTRAMUSCULAR | Status: AC
  Filled 2022-03-11: qty 50

## 2022-03-11 MED ORDER — METHOCARBAMOL 500 MG PO TABS: 500.0000 mg | ORAL_TABLET | Freq: Two times a day (BID) | ORAL | 0 refills | Status: AC

## 2022-03-11 MED ORDER — BUPIVACAINE HCL (PF) 0.5 % IJ SOLN
30.0000 mL | Freq: Once | INTRAMUSCULAR | Status: AC
  Administered 2022-03-11: 30 mL

## 2022-03-11 MED ORDER — BUPIVACAINE HCL 0.5 % IJ SOLN: 50.0000 mL | Freq: Once | INTRAMUSCULAR | Status: DC

## 2022-03-11 MED ORDER — ONDANSETRON HCL 4 MG/2ML IJ SOLN
4.0000 mg | Freq: Once | INTRAMUSCULAR | Status: AC
  Administered 2022-03-11: 4 mg via INTRAVENOUS
  Filled 2022-03-11: qty 2

## 2022-03-11 MED ORDER — MORPHINE SULFATE (PF) 4 MG/ML IV SOLN
4.0000 mg | Freq: Once | INTRAVENOUS | Status: AC
  Administered 2022-03-11: 4 mg via INTRAVENOUS
  Filled 2022-03-11: qty 1

## 2022-03-11 NOTE — ED Provider Notes (Signed)
Patient signed to me by Dr. Gilford Raid pending results of patient's imaging.  Patient also had a known dislocated right fifth digit.  This was relocated by me and postreduction x-rays show good alignment.  Sling applied by nursing and neurovascular status intact afterwards.  Patient also had incidental finding in the cervical spine concerning for possible multiple myeloma.  This was discussed at length with the patient and he will be given instructions for follow-up.  Patient is CT of the chest did show evidence of a right posterior lateral 10th rib fracture.  Patient will be given referral to hand surgery for his finger dislocation.  Will also be given prescription for pain medication muscle relaxants.  Patient agreeable to this and all questions answered   Lacretia Leigh, MD 03/11/22 [phone]

## 2022-03-11 NOTE — ED Triage Notes (Addendum)
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  Pt has pain to left hand, arm, shoulder left ribs, and left flank  C/o sob.  PA and charge RN aware.  C-collar in place in triage

## 2022-03-11 NOTE — ED Notes (Signed)
Continues to cooperate/ tolerate xrays, alert, interactive.

## 2022-03-11 NOTE — ED Provider Notes (Signed)
[facility] DEPT Provider Note   CSN: [id number] Arrival date & time: 03/11/22  1310     History  Chief Complaint  Patient presents with   [name]    Edward Hanna is a 47 y.o. male with no past medical history who comes in after a fall. Pt was placing scaffolding on the roof when one of the scaffolding pieces fell and knocked him off. This led to an 18-20 foot fall onto more scaffolding. He fell on his back which caused his back to fold and his head and feet going towards the floor. He denies loss of consciousness. He was not dizzy before the fall, and has no prior medical history. His pain is mostly located on his right abdomen/chest area and radiates to his back. He also endorses right hand pain and pins and needle feeling. His right 5th digit also is causing him distress, as well as his left elbow. Due to pain on right side, he is in respiratory distress. Lower extremities have full sensation and strength. No urinary or bowel incontinence.    Home Medications Prior to Admission medications   Not on File      Allergies    Patient has no known allergies.    Review of Systems   Review of Systems  Physical Exam Updated Vital Signs BP (!) 129/91   Pulse 77   Temp 97.7 F (36.5 C) (Oral)   Resp (!) 23   SpO2 100%  Physical Exam Pulmonary:     Effort: Tachypnea and respiratory distress present.     Breath sounds: Normal breath sounds.  Abdominal:     Tenderness: There is generalized abdominal tenderness.  Musculoskeletal:     Right shoulder: Swelling and bony tenderness present. Decreased range of motion.     Left shoulder: Normal.     Left elbow: Tenderness present.       ED Results / Procedures / Treatments   Labs (all labs ordered are listed, but only abnormal results are displayed) Labs Reviewed  COMPREHENSIVE METABOLIC PANEL - Abnormal; Notable for the following components:      Result Value   Glucose, Bld 105 (*)    BUN 21  (*)    AST 105 (*)    ALT 82 (*)    All other components within normal limits  CBC - Abnormal; Notable for the following components:   WBC 16.5 (*)    All other components within normal limits  I-STAT CHEM 8, ED - Abnormal; Notable for the following components:   BUN 21 (*)    Glucose, Bld 100 (*)    All other components within normal limits  ETHANOL  LACTIC ACID, PLASMA  PROTIME-INR  URINALYSIS, ROUTINE W REFLEX MICROSCOPIC  TYPE AND SCREEN  SAMPLE TO BLOOD BANK    EKG EKG Interpretation  Date/Time:  Tuesday March 11 2022 14:19:44 EST Ventricular Rate:  70 PR Interval:  133 QRS Duration: 100 QT Interval:  391 QTC Calculation: 422 R Axis:   78 Text Interpretation: Sinus rhythm No significant change since last tracing Confirmed by Isla Pence [phone]) on 03/11/2022 2:41:34 PM  Radiology DG Pelvis Portable  Result Date: 03/11/2022 CLINICAL DATA:  Fall. EXAM: PORTABLE PELVIS 1-2 VIEWS COMPARISON:  None Available. FINDINGS: There is no evidence of pelvic fracture or diastasis. No pelvic bone lesions are seen. IMPRESSION: Negative. Electronically Signed   By: Titus Dubin M.D.   On: 03/11/2022 15:15   DG Forearm Right  Result Date: 03/11/2022 CLINICAL  DATA:  Blunt trauma. Working on saline tile and fell from 18 feet, landing on right side on bar, then falling to floor. EXAM: RIGHT FOREARM - 2 VIEW COMPARISON:  Right fifth finger radiographs 03/11/2022. Right forearm radiographs 08/23/2013. FINDINGS: Fracture/dislocation of the right fifth proximal and middle phalanges is better evaluated on dedicated radiographs. No acute fracture or dislocation of the right radius or ulna. No suspicious bony abnormality. No focal soft tissue swelling. IMPRESSION: No acute fracture or dislocation of th right radius or ulna. Electronically Signed   By: Orvan Falconer M.D   On: 03/11/2022 14:47   DG Chest Port 1 View  Result Date: 03/11/2022 CLINICAL DATA:  Fall from 18 ft.  Left chest  pain. EXAM: PORTABLE CHEST 1 VIEW COMPARISON:  None Available. FINDINGS: 1434 hours. Suboptimal inspiration. Allowing for this, the heart size and mediastinal contours are normal, without evidence of mediastinal hematoma. The lungs appear clear. There is no pleural effusion or pneumothorax. No acute fractures are demonstrated. Telemetry leads overlie the chest. IMPRESSION: No evidence of acute chest injury or active cardiopulmonary process. Suboptimal inspiration. Electronically Signed   By: Carey Bullocks M.D.   On: 03/11/2022 14:44   DG Elbow Complete Left  Result Date: 03/11/2022 CLINICAL DATA:  Pain after trauma EXAM: LEFT ELBOW - COMPLETE 4  VIEW FINDINGS: No acute fracture or dislocation. Preserved joint spaces and bone mineralization. There are some well corticated densities along the margin of the lateral epicondyle and along the proximal ulna medially. These could be sequela of remote trauma or accessory ossicles. No joint effusion seen on the lateral view. IMPRESSION: No acute osseous abnormality. Electronically Signed   By: Karen Kays M.D.   On: 03/11/2022 14:44   DG Finger Little Right  Result Date: 03/11/2022 CLINICAL DATA:  Pain after trauma EXAM: RIGHT LITTLE FINGER 3V COMPARISON:  12/05/2014 x-ray FINDINGS: Dislocation seen of the proximal interphalangeal joint of the fifth digit with a small focal fracture along the anterior aspect of the base of the middle phalanx. The middle and distal phalanx are displaced posterior to the proximal. No additional fracture or dislocation. Soft tissue swelling. IMPRESSION: Fracture and dislocation of the proximal interphalangeal joint of the fifth digit. Soft tissue swelling. Electronically Signed   By: Karen Kays M.D.   On: 03/11/2022 14:43     Medications Ordered in ED Medications  iohexol (OMNIPAQUE) 300 MG/ML solution 100 mL (has no administration in time range)  morphine (PF) 4 MG/ML injection 4 mg (4 mg Intravenous Given 03/11/22 1359)   ondansetron (ZOFRAN) injection 4 mg (4 mg Intravenous Given 03/11/22 1400)  sodium chloride 0.9 % bolus 1,000 mL (1,000 mLs Intravenous New Bag/Given 03/11/22 1400)  bupivacaine(PF) (MARCAINE) 0.5 % injection 30 mL (30 mLs Infiltration Given by Other 03/11/22 1458)    ED Course/ Medical Decision Making/ A&P  {   Medical Decision Making Patient presents after 18 fall onto scaffolding.  He denies any loss of consciousness, or any preceding events that have caused him to fall besides a scaffolding falling onto him.  Given height of fall, patient will undergo multiple imaging modalities to evaluate if there is any underlying fractures.  Patient is in respiratory distress, secondary to pain.  Chest x-ray revealed no pneumothorax or broken ribs.  Right fifth finger fracture and is located.  Will reset.  No obvious dislocation or fracture of left upper extremity on X-Rays. Will evaluate further when imaging modalities are back.  Will start him on morphine for pain  management. Will sign out patient to next team.     Final Clinical Impression(s) / ED Diagnoses Final diagnoses:  None  Traumatic Delorise Royals, MD 03/11/22 1521    Isla Pence, MD 03/11/22 [phone]

## 2022-03-11 NOTE — ED Notes (Signed)
EDPs at [facility] for finger reduction

## 2022-03-11 NOTE — ED Notes (Signed)
Clothing removed for xrays. IVF infusing.

## 2022-03-11 NOTE — Discharge Instructions (Addendum)
You have an abnormality in the bones of your neck that is suspicious for possible multiple myeloma.  Follow-up with your doctor for repeat imaging and further evaluation.

## 2022-03-11 NOTE — ED Notes (Signed)
Pt restless, alert, following commands, cooperative with xray at [facility]. IVF infusing. Labs sent. VSS. SPO2 100% on RA, Placed on 2L [place].

## 2022-03-11 NOTE — ED Notes (Signed)
MD at [facility] Pt access into room. Pt alert, interactive, cooperative.

## 2022-03-11 NOTE — ED Provider Notes (Signed)
  Physical Exam  BP (!) 121/109   Pulse 68   Temp 97.7 F (36.5 C) (Oral)   Resp 20   SpO2 100%   Physical Exam  Procedures  Reduction of dislocation  Date/Time: 03/11/2022 5:04 PM  Performed by: Lacretia Leigh, MD Authorized by: Lacretia Leigh, MD  Consent: Verbal consent obtained. Risks and benefits: risks, benefits and alternatives were discussed Consent given by: patient Time out: Immediately prior to procedure a "time out" was called to verify the correct patient, procedure, equipment, support staff and site/side marked as required. Local anesthesia used: no  Anesthesia: Local anesthesia used: no Patient tolerance: patient tolerated the procedure well with no immediate complications     ED Course / MDM    Medical Decision Making Amount and/or Complexity of Data Reviewed Radiology: ordered.  Risk Prescription drug management.   Patient signed to me by Dr. Gilford Raid pending results of patient's imaging.  Patient also had a known dislocated right fifth digit.  This was relocated by me and postreduction x-rays show good alignment.  Sling applied by nursing and neurovascular status intact afterwards.  Patient also had incidental finding in the cervical spine concerning for possible multiple myeloma.  This was discussed at length with the patient and he will be given instructions for follow-up.  Patient is CT of the chest did show evidence of a right posterior lateral 10th rib fracture.  Patient will be given referral to hand surgery for his finger dislocation.  Will also be given prescription for pain medication muscle relaxants.  Patient agreeable to this and all questions answered   Lacretia Leigh, MD 03/11/22 1703       Lacretia Leigh, MD 03/11/22 [phone]

## 2022-03-11 NOTE — ED Provider Triage Note (Signed)
Emergency Medicine Provider Triage Evaluation Note  Edward Hanna , a 47 y.o. male  was evaluated in triage.  Pt complains of multiple bodily pains after sustaining a fall 30 minutes prior to arrival.  Was scaffolding fixing his ceiling tiles when he fell.  His back/right side hit a paralumbar, and caused his back to fold with his head and feet going towards the floor.  Did not land on feet.  Did not lose consciousness, though does acknowledge hitting head.  Notes increased difficulty breathing.  Pain mainly includes mid back, right shoulder, right little finger.  Denies numbness or tingling of the lower extremities.  Denies urinary or bowel incontinence.  Review of Systems  Positive:  Negative: See above  Physical Exam  BP (!) 116/58 (BP Location: Left Arm)   Pulse 74   Temp 97.7 F (36.5 C) (Oral)   Resp (!) 22   SpO2 100%  Gen:   Awake, no distress   Resp:  Normal effort  MSK:   ROM deferred due to possibility of fracture/dislocation.  Notable deformity of the right fifth digit.  Patient notably leaning to the right side. Other:  PERRLA.  EOMI to provider face.  Sensation appears grossly intact of lower extremities and left extremity.  Also with tenderness of the left medial also with tenderness of the left medial elbow..  Airway intact.  Lungs grossly CTAB.  No significant open wounds or hemorrhage appreciated.  Medical Decision Making  Medically screening exam initiated at 1:26 PM.  Appropriate orders placed.  Brylin Stanislawski was informed that the remainder of the evaluation will be completed by another provider, this initial triage assessment does not replace that evaluation, and the importance of remaining in the ED until their evaluation is complete.  Discussed patient with triage nurse, to come back to the next available room soon as possible.   Prince Rome, PA-C 93/[id number]/01 1332

## 2022-03-12 ENCOUNTER — Encounter (HOSPITAL_COMMUNITY): Payer: Self-pay
